# Patient Record
Sex: Male | Born: 1985 | Race: Black or African American | Hispanic: No | Marital: Single | Smoking: Never smoker
Health system: Southern US, Community
[De-identification: ages and names within clinical notes are randomized; demographics above are authoritative.]

---
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
## 2008-09-28 ENCOUNTER — Emergency Department: Payer: Self-pay | Admitting: Emergency Medicine
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
## 2010-02-28 ENCOUNTER — Emergency Department: Payer: Self-pay | Admitting: Unknown Physician Specialty

## 2010-03-22 ENCOUNTER — Emergency Department: Payer: Self-pay | Admitting: Emergency Medicine

## 2018-06-04 ENCOUNTER — Emergency Department
Admission: EM | Admit: 2018-06-04 | Discharge: 2018-06-04 | Disposition: A | Discharge status: Home / Self Care | Payer: Self-pay | Attending: Emergency Medicine | Admitting: Emergency Medicine

## 2018-06-04 ENCOUNTER — Other Ambulatory Visit: Payer: Self-pay

## 2018-06-04 ENCOUNTER — Encounter: Payer: Self-pay | Admitting: Emergency Medicine

## 2018-06-04 DIAGNOSIS — K529 Noninfective gastroenteritis and colitis, unspecified: Secondary | ICD-10-CM | POA: Insufficient documentation

## 2018-06-04 MED ORDER — SODIUM CHLORIDE 0.9 % IV BOLUS
1000.0000 mL | Freq: Once | INTRAVENOUS | Status: AC
  Administered 2018-06-04: 20:00:00 1000 mL via INTRAVENOUS

## 2018-06-04 MED ORDER — ONDANSETRON HCL 4 MG/2ML IJ SOLN
4.0000 mg | Freq: Once | INTRAMUSCULAR | Status: AC
  Administered 2018-06-04: 4 mg via INTRAVENOUS
  Filled 2018-06-04: qty 2

## 2018-06-04 NOTE — Discharge Instructions (Addendum)
Return to the ER for new, worsening, or persistent severe vomiting, abdominal pain, fever, weakness, or any other new or worsening symptoms that concern you. 

## 2018-06-04 NOTE — ED Provider Notes (Signed)
[facility] Emergency Department Provider Note ____________________________________________   First MD Initiated Contact with Patient 06/04/18 1903     (approximate)  I have reviewed the triage vital signs and the nursing notes.   HISTORY  Chief Complaint Abdominal Pain and Emesis    HPI Richard Francis is a 33 y.o. male with no significant PMH who presents with diarrhea earlier today, total about 9 episodes, nonbloody, and associate with crampy abdominal pain.  The patient subsequent developed nausea and had several episodes of nonbloody, nonbilious emesis.  He denies any abdominal pain currently although he is still nauseous.  He states he ate some reheated fajitas last night but no other unusual foods.  He has not traveled anywhere and has no sick contacts.  History reviewed. No pertinent past medical history.  There are no active problems to display for this patient.   History reviewed. No pertinent surgical history.  Prior to Admission medications   Not on File    Allergies Patient has no known allergies.  History reviewed. No pertinent family history.  Social History Social History   Tobacco Use  . Smoking status: Never Smoker  . Smokeless tobacco: Never Used  Substance Use Topics  . Alcohol use: Yes    Frequency: Never  . Drug use: Never    Review of Systems  Constitutional: Positive for resolved fever Eyes: No redness. ENT: No sore throat. Cardiovascular: Denies chest pain. Respiratory: Denies shortness of breath. Gastrointestinal: Positive for nausea, vomiting, diarrhea. Genitourinary: Negative for flank pain.  Musculoskeletal: Negative for back pain. Skin: Negative for rash. Neurological: Negative for headache.   ____________________________________________   PHYSICAL EXAM:  VITAL SIGNS: ED Triage Vitals  Enc Vitals Group     BP 06/04/18 1828 (!) 144/82     Pulse Rate 06/04/18 1828 (!) 103     Resp 06/04/18  1828 18     Temp 06/04/18 1828 99.5 F (37.5 C)     Temp Source 06/04/18 1828 Oral     SpO2 06/04/18 1828 100 %     Weight 06/04/18 1825 140 lb (63.5 kg)     Height 06/04/18 1825 5\' 9"  (1.753 m)     Head Circumference --      Peak Flow --      Pain Score 06/04/18 1825 8     Pain Loc --      Pain Edu? --      Excl. in GC? --     Constitutional: Alert and oriented.  Relatively well appearing and in no acute distress. Eyes: Conjunctivae are normal.  No scleral icterus. Head: Atraumatic. Nose: No congestion/rhinnorhea. Mouth/Throat: Mucous membranes are slightly dry.   Neck: Normal range of motion.  Cardiovascular: Borderline tachycardia, regular rhythm. Good peripheral circulation. Respiratory: Normal respiratory effort.  No retractions.  Gastrointestinal: Soft and nontender. No distention.  Genitourinary: No flank tenderness. Musculoskeletal: Extremities warm and well perfused.  Neurologic:  Normal speech and language. No gross focal neurologic deficits are appreciated.  Skin:  Skin is warm and dry. No rash noted. Psychiatric: Mood and affect are normal. Speech and behavior are normal.  ____________________________________________   LABS (all labs ordered are listed, but only abnormal results are displayed)  Labs Reviewed - No data to display ____________________________________________  EKG   ____________________________________________  RADIOLOGY    ____________________________________________   PROCEDURES  Procedure(s) performed: No  Procedures  Critical Care performed: No ____________________________________________   INITIAL IMPRESSION / ASSESSMENT AND PLAN / ED COURSE  Pertinent  labs & imaging results that were available during my care of the patient were reviewed by me and considered in my medical decision making (see chart for details).  33 year old male with no significant past medical history presents with nausea, vomiting, diarrhea today with  some crampy abdominal pain after eating reheated food last night.  On exam, the patient is well-appearing.  His vital signs are normal except for low-grade temperature and borderline tachycardia.  The abdomen is soft with no focal tenderness.  Overall presentation is consistent with a viral gastroenteritis versus foodborne illness.  The patient has no respiratory symptoms or anything to suggest COVID-19.  Given his age and the reassuring exam there is no indication for lab work-up.  We will give an IV fluid bolus and some antiemetic medication and reassess.  ----------------------------------------- 9:11 PM on 06/04/2018 -----------------------------------------  The tachycardia has resolved.  The patient is feeling better and would like to go home.  He is tolerating p.o.  He is stable for discharge home.  Return precautions given, and he expresses understanding.  ____________________________________________   FINAL CLINICAL IMPRESSION(S) / ED DIAGNOSES  Final diagnoses:  Gastroenteritis      NEW MEDICATIONS STARTED DURING THIS VISIT:  New Prescriptions   No medications on file     Note:  This document was prepared using Dragon voice recognition software and may include unintentional dictation errors.    Dionne Bucy, MD 06/04/18 2111

## 2018-06-04 NOTE — ED Notes (Signed)
Vomited 3 times and diarrhea 9 times today

## 2018-06-04 NOTE — ED Triage Notes (Signed)
Pt c/o abdominal pain with NVD since this AM.  Pt thinks he has food poisoning. Pt had fever 102 per report.  No cough or SHOB.  Unlabored.

## 2020-02-19 ENCOUNTER — Ambulatory Visit
Admission: EM | Admit: 2020-02-19 | Discharge: 2020-02-19 | Disposition: A | Discharge status: Home / Self Care | Payer: HRSA Program | Attending: Physician Assistant | Admitting: Physician Assistant

## 2020-02-19 ENCOUNTER — Other Ambulatory Visit: Payer: Self-pay

## 2020-02-19 DIAGNOSIS — R6883 Chills (without fever): Secondary | ICD-10-CM | POA: Diagnosis not present

## 2020-02-19 DIAGNOSIS — R059 Cough, unspecified: Secondary | ICD-10-CM

## 2020-02-19 DIAGNOSIS — U071 COVID-19: Secondary | ICD-10-CM | POA: Diagnosis present

## 2020-02-19 DIAGNOSIS — B349 Viral infection, unspecified: Secondary | ICD-10-CM | POA: Diagnosis present

## 2020-02-19 DIAGNOSIS — Z20822 Contact with and (suspected) exposure to covid-19: Secondary | ICD-10-CM | POA: Diagnosis present

## 2020-02-19 LAB — RESP PANEL BY RT-PCR (FLU A&B, COVID) ARPGX2
Influenza A by PCR: NEGATIVE
Influenza B by PCR: NEGATIVE
SARS Coronavirus 2 by RT PCR: POSITIVE — AB

## 2020-02-19 NOTE — ED Provider Notes (Signed)
[facility]    CSN: [id number] Arrival date & time: 02/19/20  1303      History   Chief Complaint Chief Complaint  Patient presents with  . Cough  . Chills    HPI Richard Francis is a 34 y.o. male presenting for 2-day history of chills, body aches, and cough. Also admits to fatigue and congestion. Patient denies fever, weakness, sore throat, chest pain, breathing problems, abdominal pain, and/V/D, or change in smell and taste. Patient denies any known COVID-19 exposure.  Patient has not received COVID-19 vaccines.  Patient has taken 1 gram of Tylenol today. Patient is otherwise healthy and denies any chronic medical conditions. Denies any routine medications. He has no other complaints or concerns today.  HPI  History reviewed. No pertinent past medical history.  There are no problems to display for this patient.   History reviewed. No pertinent surgical history.     Home Medications    Prior to Admission medications   Not on File    Family History History reviewed. No pertinent family history.  Social History Social History   Tobacco Use  . Smoking status: Never Smoker  . Smokeless tobacco: Never Used  Vaping Use  . Vaping Use: Some days  Substance Use Topics  . Alcohol use: Yes    Comment: social drinker   . Drug use: Never     Allergies   Patient has no known allergies.   Review of Systems Review of Systems  Constitutional: Positive for chills and fatigue. Negative for fever.  HENT: Positive for congestion. Negative for rhinorrhea, sinus pressure, sinus pain and sore throat.   Respiratory: Positive for cough. Negative for shortness of breath.   Gastrointestinal: Negative for abdominal pain, diarrhea, nausea and vomiting.  Musculoskeletal: Positive for myalgias.  Neurological: Negative for weakness, light-headedness and headaches.  Hematological: Negative for adenopathy.     Physical Exam Triage Vital Signs ED Triage Vitals   Enc Vitals Group     BP 02/19/20 1604 131/85     Pulse Rate 02/19/20 1604 76     Resp 02/19/20 1604 18     Temp 02/19/20 1604 99.7 F (37.6 C)     Temp Source 02/19/20 1604 Oral     SpO2 02/19/20 1604 100 %     Weight 02/19/20 1553 143 lb (64.9 kg)     Height --      Head Circumference --      Peak Flow --      Pain Score 02/19/20 1553 0     Pain Loc --      Pain Edu? --      Excl. in GC? --    No data found.  Updated Vital Signs BP 131/85 (BP Location: Left Arm)   Pulse 76   Temp 99.7 F (37.6 C) (Oral)   Resp 18   Wt 143 lb (64.9 kg)   SpO2 100%   BMI 21.12 kg/m    Physical Exam Vitals and nursing note reviewed.  Constitutional:      General: He is not in acute distress.    Appearance: Normal appearance. He is well-developed and well-nourished. He is not ill-appearing or diaphoretic.  HENT:     Head: Normocephalic and atraumatic.     Nose: Congestion present.     Mouth/Throat:     Mouth: Oropharynx is clear and moist and mucous membranes are normal.     Pharynx: Uvula midline. No oropharyngeal exudate.     Tonsils:  No tonsillar abscesses.  Eyes:     General: No scleral icterus.       Right eye: No discharge.        Left eye: No discharge.     Extraocular Movements: EOM normal.     Conjunctiva/sclera: Conjunctivae normal.  Neck:     Thyroid: No thyromegaly.     Trachea: No tracheal deviation.  Cardiovascular:     Rate and Rhythm: Normal rate and regular rhythm.     Heart sounds: Normal heart sounds.  Pulmonary:     Effort: Pulmonary effort is normal. No respiratory distress.     Breath sounds: Normal breath sounds. No wheezing or rales.  Musculoskeletal:     Cervical back: Neck supple.  Skin:    General: Skin is warm and dry.     Findings: No rash.  Neurological:     General: No focal deficit present.     Mental Status: He is alert. Mental status is at baseline.     Motor: No weakness.     Gait: Gait normal.  Psychiatric:        Mood and Affect:  Mood normal.        Behavior: Behavior normal.        Thought Content: Thought content normal.      UC Treatments / Results  Labs (all labs ordered are listed, but only abnormal results are displayed) Labs Reviewed  RESP PANEL BY RT-PCR (FLU A&B, COVID) [id number]    EKG   Radiology No results found.  Procedures Procedures (including critical care time)  Medications Ordered in UC Medications - No data to display  Initial Impression / Assessment and Plan / UC Course  I have reviewed the triage vital signs and the nursing notes.  Pertinent labs & imaging results that were available during my care of the patient were reviewed by me and considered in my medical decision making (see chart for details).   Respiratory positive for COVID-19. Reviewed results with patient. CDC guidelines, isolation protocol, and ED precautions reviewed. Supportive care advised with continue over-the-counter cough/decongestant medication and increasing rest and fluids. Patient is a 34 year old healthy African-American male without any chronic medical conditions and with mild symptoms and normal vital signs. Advised patient he should be suitable for at home care, but reviewed ED precautions again.  Final Clinical Impressions(s) / UC Diagnoses   Final diagnoses:  Viral illness  Cough  Chills  Exposure to COVID-19 virus     Discharge Instructions     You have received COVID testing today either for positive exposure, concerning symptoms that could be related to COVID infection, screening purposes, or re-testing after confirmed positive.  Your test obtained today checks for active viral infection in the last 1-2 weeks. If your test is negative now, you can still test positive later. So, if you do develop symptoms you should either get re-tested and/or isolate x 10 days. Please follow CDC guidelines.  While Rapid antigen tests come back in 15-20 minutes, send out PCR/molecular test results typically come  back within 24 hours. In the mean time, if you are symptomatic, assume this could be a positive test and treat/monitor yourself as if you do have COVID.   We will call with test results. Please download the MyChart app and set up a profile to access test results.   If symptomatic, go home and rest. Push fluids. Take Tylenol as needed for discomfort. Gargle warm salt water. Throat lozenges. Take Mucinex DM or Robitussin  for cough. Humidifier in bedroom to ease coughing. Warm showers. Also review the COVID handout for more information.  COVID-19 INFECTION: The incubation period of COVID-19 is approximately 14 days after exposure, with most symptoms developing in roughly 4-5 days. Symptoms may range in severity from mild to critically severe. Roughly 80% of those infected will have mild symptoms. People of any age may become infected with COVID-19 and have the ability to transmit the virus. The most common symptoms include: fever, fatigue, cough, body aches, headaches, sore throat, nasal congestion, shortness of breath, nausea, vomiting, diarrhea, changes in smell and/or taste.    COURSE OF ILLNESS Some patients may begin with mild disease which can progress quickly into critical symptoms. If your symptoms are worsening please call ahead to the Emergency Department and proceed there for further treatment. Recovery time appears to be roughly 1-2 weeks for mild symptoms and 3-6 weeks for severe disease.   GO IMMEDIATELY TO ER FOR FEVER YOU ARE UNABLE TO GET DOWN WITH TYLENOL, BREATHING PROBLEMS, CHEST PAIN, FATIGUE, LETHARGY, INABILITY TO EAT OR DRINK, ETC  QUARANTINE AND ISOLATION: To help decrease the spread of COVID-19 please remain isolated if you have COVID infection or are highly suspected to have COVID infection. This means -stay home and isolate to one room in the home if you live with others. Do not share a bed or bathroom with others while ill, sanitize and wipe down all countertops and keep common  areas clean and disinfected. You may discontinue isolation if you have a mild case and are asymptomatic 10 days after symptom onset as long as you have been fever free >24 hours without having to take Motrin or Tylenol. If your case is more severe (meaning you develop pneumonia or are admitted in the hospital), you may have to isolate longer.   If you have been in close contact (within 6 feet) of someone diagnosed with COVID 19, you are advised to quarantine in your home for 14 days as symptoms can develop anywhere from 2-14 days after exposure to the virus. If you develop symptoms, you  must isolate.  Most current guidelines for COVID after exposure -isolate 10 days if you ARE NOT tested for COVID as long as symptoms do not develop -isolate 7 days if you are tested and remain asymptomatic -You do not necessarily need to be tested for COVID if you have + exposure and        develop   symptoms. Just isolate at home x10 days from symptom onset During this global pandemic, CDC advises to practice social distancing, try to stay at least 496ft away from others at all times. Wear a face covering. Wash and sanitize your hands regularly and avoid going anywhere that is not necessary.  KEEP IN MIND THAT THE COVID TEST IS NOT 100% ACCURATE AND YOU SHOULD STILL DO EVERYTHING TO PREVENT POTENTIAL SPREAD OF VIRUS TO OTHERS (WEAR MASK, WEAR GLOVES, WASH HANDS AND SANITIZE REGULARLY). IF INITIAL TEST IS NEGATIVE, THIS MAY NOT MEAN YOU ARE DEFINITELY NEGATIVE. MOST ACCURATE TESTING IS DONE 5-7 DAYS AFTER EXPOSURE.   It is not advised by CDC to get re-tested after receiving a positive COVID test since you can still test positive for weeks to months after you have already cleared the virus.   *If you have not been vaccinated for COVID, I strongly suggest you consider getting vaccinated as long as there are no contraindications.      ED Prescriptions    None  PDMP not reviewed this encounter.   Shirlee Latch,  PA-C 02/21/20 1225

## 2020-02-19 NOTE — ED Triage Notes (Signed)
Patient presents to Urgent Care with complaints of cough chills, and body aches x 2 days ago. Patient reports last dose of Tylenol 1000 today.   Denies, N/V, abdominal pain, or diarrhea.

## 2020-02-19 NOTE — Discharge Instructions (Addendum)
